# Patient Record
Sex: Male | Born: 2000 | Race: White | Hispanic: No | Marital: Single | Smoking: Current some day smoker
Health system: Southern US, Community
[De-identification: ages and names within clinical notes are randomized; demographics above are authoritative.]

---
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
## 2021-01-13 ENCOUNTER — Other Ambulatory Visit: Payer: Self-pay

## 2021-01-13 ENCOUNTER — Other Ambulatory Visit: Payer: Self-pay | Admitting: Family Medicine

## 2021-01-13 ENCOUNTER — Ambulatory Visit: Payer: Self-pay

## 2021-01-13 DIAGNOSIS — Z0000 Encounter for general adult medical examination without abnormal findings: Secondary | ICD-10-CM

## 2021-02-22 DIAGNOSIS — S0081XA Abrasion of other part of head, initial encounter: Secondary | ICD-10-CM | POA: Diagnosis not present

## 2021-02-22 DIAGNOSIS — R55 Syncope and collapse: Secondary | ICD-10-CM | POA: Diagnosis not present

## 2021-02-22 DIAGNOSIS — S0031XA Abrasion of nose, initial encounter: Secondary | ICD-10-CM | POA: Diagnosis not present

## 2021-02-23 DIAGNOSIS — R519 Headache, unspecified: Secondary | ICD-10-CM | POA: Diagnosis not present

## 2021-02-23 DIAGNOSIS — R55 Syncope and collapse: Secondary | ICD-10-CM | POA: Diagnosis not present

## 2021-02-23 DIAGNOSIS — S0081XA Abrasion of other part of head, initial encounter: Secondary | ICD-10-CM | POA: Diagnosis not present

## 2021-02-23 DIAGNOSIS — K2970 Gastritis, unspecified, without bleeding: Secondary | ICD-10-CM | POA: Diagnosis not present
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
## 2021-03-19 ENCOUNTER — Encounter: Payer: Self-pay | Admitting: Cardiology

## 2021-03-19 ENCOUNTER — Other Ambulatory Visit: Payer: Self-pay

## 2021-03-19 ENCOUNTER — Ambulatory Visit: Payer: BC Managed Care – PPO | Admitting: Cardiology

## 2021-03-19 VITALS — Resp 16 | Ht 68.0 in | Wt 213.0 lb

## 2021-03-19 DIAGNOSIS — R030 Elevated blood-pressure reading, without diagnosis of hypertension: Secondary | ICD-10-CM

## 2021-03-19 DIAGNOSIS — R55 Syncope and collapse: Secondary | ICD-10-CM | POA: Insufficient documentation

## 2021-04-07 ENCOUNTER — Ambulatory Visit: Payer: Managed Care, Other (non HMO)

## 2021-04-07 ENCOUNTER — Other Ambulatory Visit: Payer: Self-pay

## 2021-04-07 DIAGNOSIS — R55 Syncope and collapse: Secondary | ICD-10-CM | POA: Diagnosis not present

## 2021-04-09 DIAGNOSIS — R55 Syncope and collapse: Secondary | ICD-10-CM | POA: Diagnosis not present

## 2021-04-09 DIAGNOSIS — R519 Headache, unspecified: Secondary | ICD-10-CM | POA: Diagnosis not present

## 2021-04-09 DIAGNOSIS — K219 Gastro-esophageal reflux disease without esophagitis: Secondary | ICD-10-CM | POA: Diagnosis not present

## 2021-04-09 DIAGNOSIS — K2970 Gastritis, unspecified, without bleeding: Secondary | ICD-10-CM | POA: Diagnosis not present

## 2021-05-01 DIAGNOSIS — H04123 Dry eye syndrome of bilateral lacrimal glands: Secondary | ICD-10-CM | POA: Diagnosis not present

## 2021-05-01 DIAGNOSIS — H40033 Anatomical narrow angle, bilateral: Secondary | ICD-10-CM | POA: Diagnosis not present

## 2021-06-08 DIAGNOSIS — K219 Gastro-esophageal reflux disease without esophagitis: Secondary | ICD-10-CM | POA: Diagnosis not present

## 2021-06-08 DIAGNOSIS — F331 Major depressive disorder, recurrent, moderate: Secondary | ICD-10-CM | POA: Diagnosis not present

## 2021-06-08 DIAGNOSIS — G4700 Insomnia, unspecified: Secondary | ICD-10-CM | POA: Diagnosis not present

## 2021-06-08 DIAGNOSIS — F411 Generalized anxiety disorder: Secondary | ICD-10-CM | POA: Diagnosis not present

## 2021-06-16 DIAGNOSIS — R030 Elevated blood-pressure reading, without diagnosis of hypertension: Secondary | ICD-10-CM | POA: Insufficient documentation

## 2021-06-16 NOTE — Progress Notes (Signed)
No show

## 2021-06-17 ENCOUNTER — Ambulatory Visit: Payer: Managed Care, Other (non HMO) | Admitting: Cardiology

## 2021-06-17 DIAGNOSIS — R55 Syncope and collapse: Secondary | ICD-10-CM

## 2021-06-17 DIAGNOSIS — R030 Elevated blood-pressure reading, without diagnosis of hypertension: Secondary | ICD-10-CM

## 2021-06-24 DIAGNOSIS — Z23 Encounter for immunization: Secondary | ICD-10-CM | POA: Diagnosis not present

## 2021-06-24 DIAGNOSIS — Z1152 Encounter for screening for COVID-19: Secondary | ICD-10-CM | POA: Diagnosis not present

## 2021-07-02 DIAGNOSIS — E785 Hyperlipidemia, unspecified: Secondary | ICD-10-CM | POA: Diagnosis not present

## 2021-07-02 DIAGNOSIS — Z0000 Encounter for general adult medical examination without abnormal findings: Secondary | ICD-10-CM | POA: Diagnosis not present

## 2022-05-05 DIAGNOSIS — K219 Gastro-esophageal reflux disease without esophagitis: Secondary | ICD-10-CM | POA: Diagnosis not present

## 2022-05-05 DIAGNOSIS — F411 Generalized anxiety disorder: Secondary | ICD-10-CM | POA: Diagnosis not present

## 2022-05-05 DIAGNOSIS — F331 Major depressive disorder, recurrent, moderate: Secondary | ICD-10-CM | POA: Diagnosis not present

## 2022-05-05 DIAGNOSIS — G4700 Insomnia, unspecified: Secondary | ICD-10-CM | POA: Diagnosis not present

## 2022-05-25 DIAGNOSIS — Z23 Encounter for immunization: Secondary | ICD-10-CM | POA: Diagnosis not present

## 2022-05-27 IMAGING — DX DG CHEST 2V
2 series · 2 of 2 positions shown · non-contrast
Comparison: None.

CLINICAL DATA: Physical exam.

EXAM:
CHEST - 2 VIEW

[chest pa]
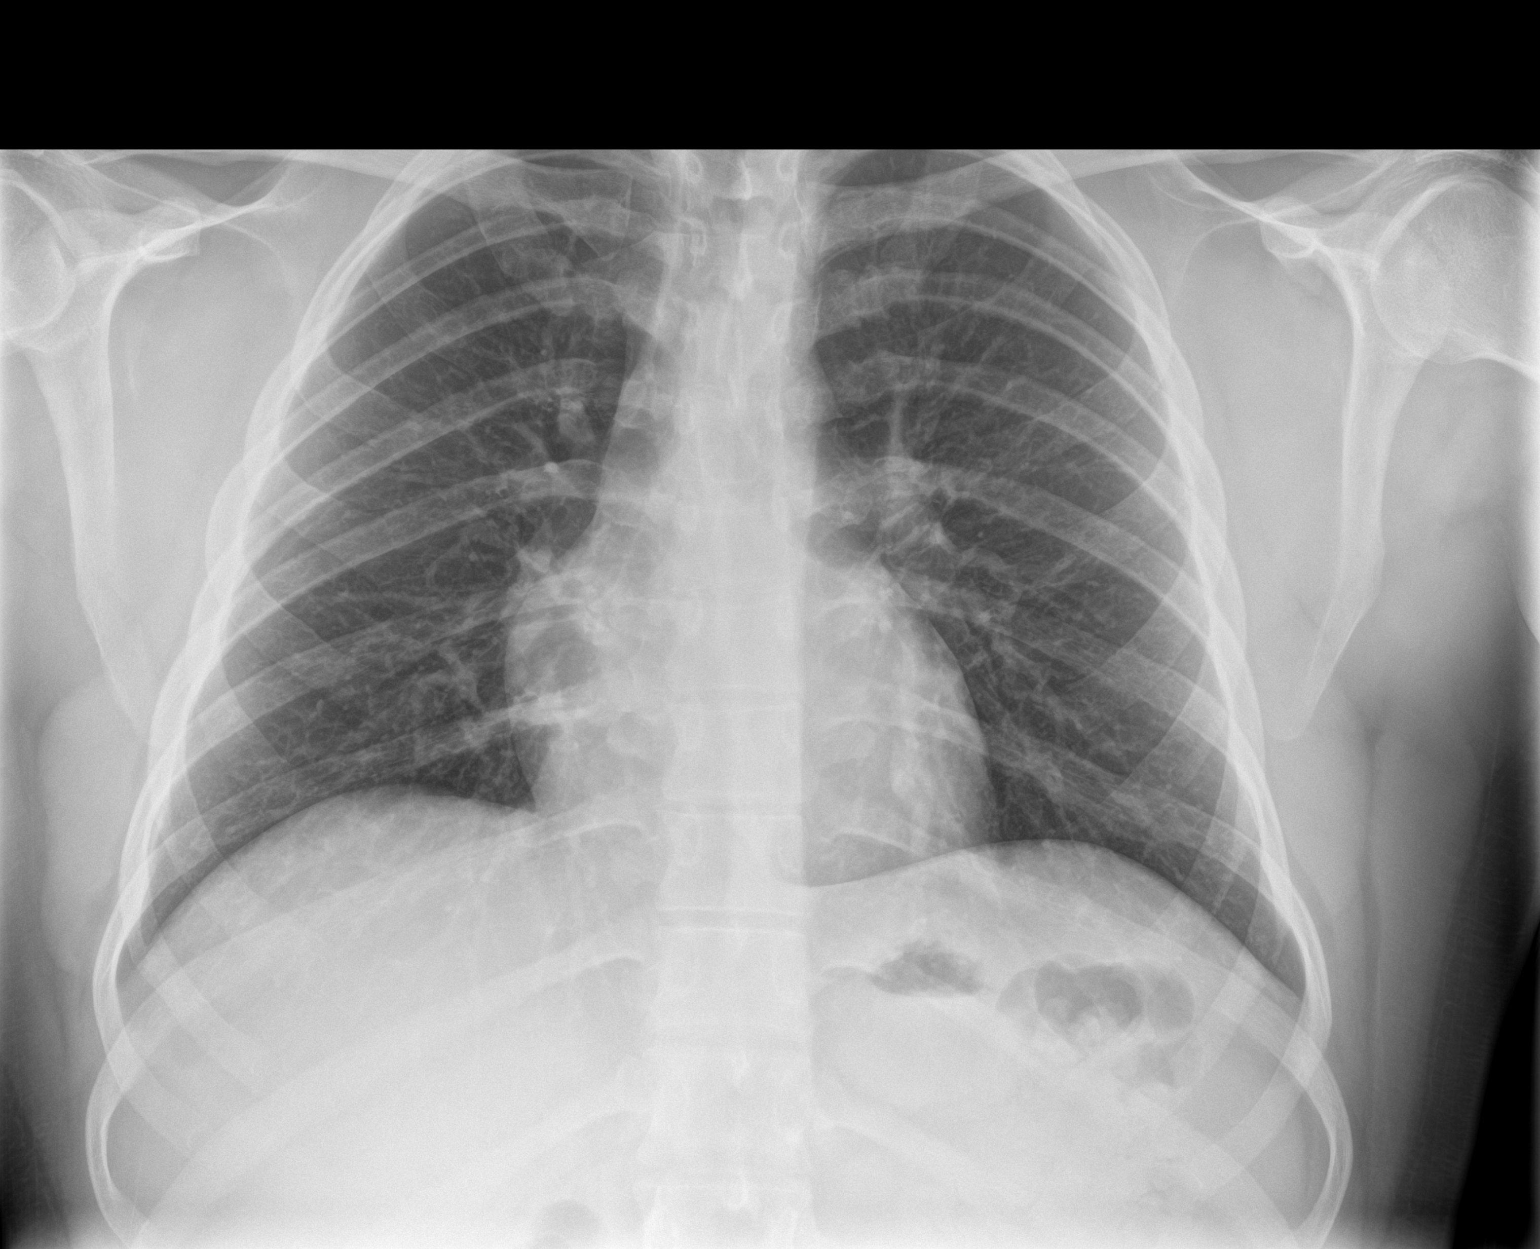

[chest lat]
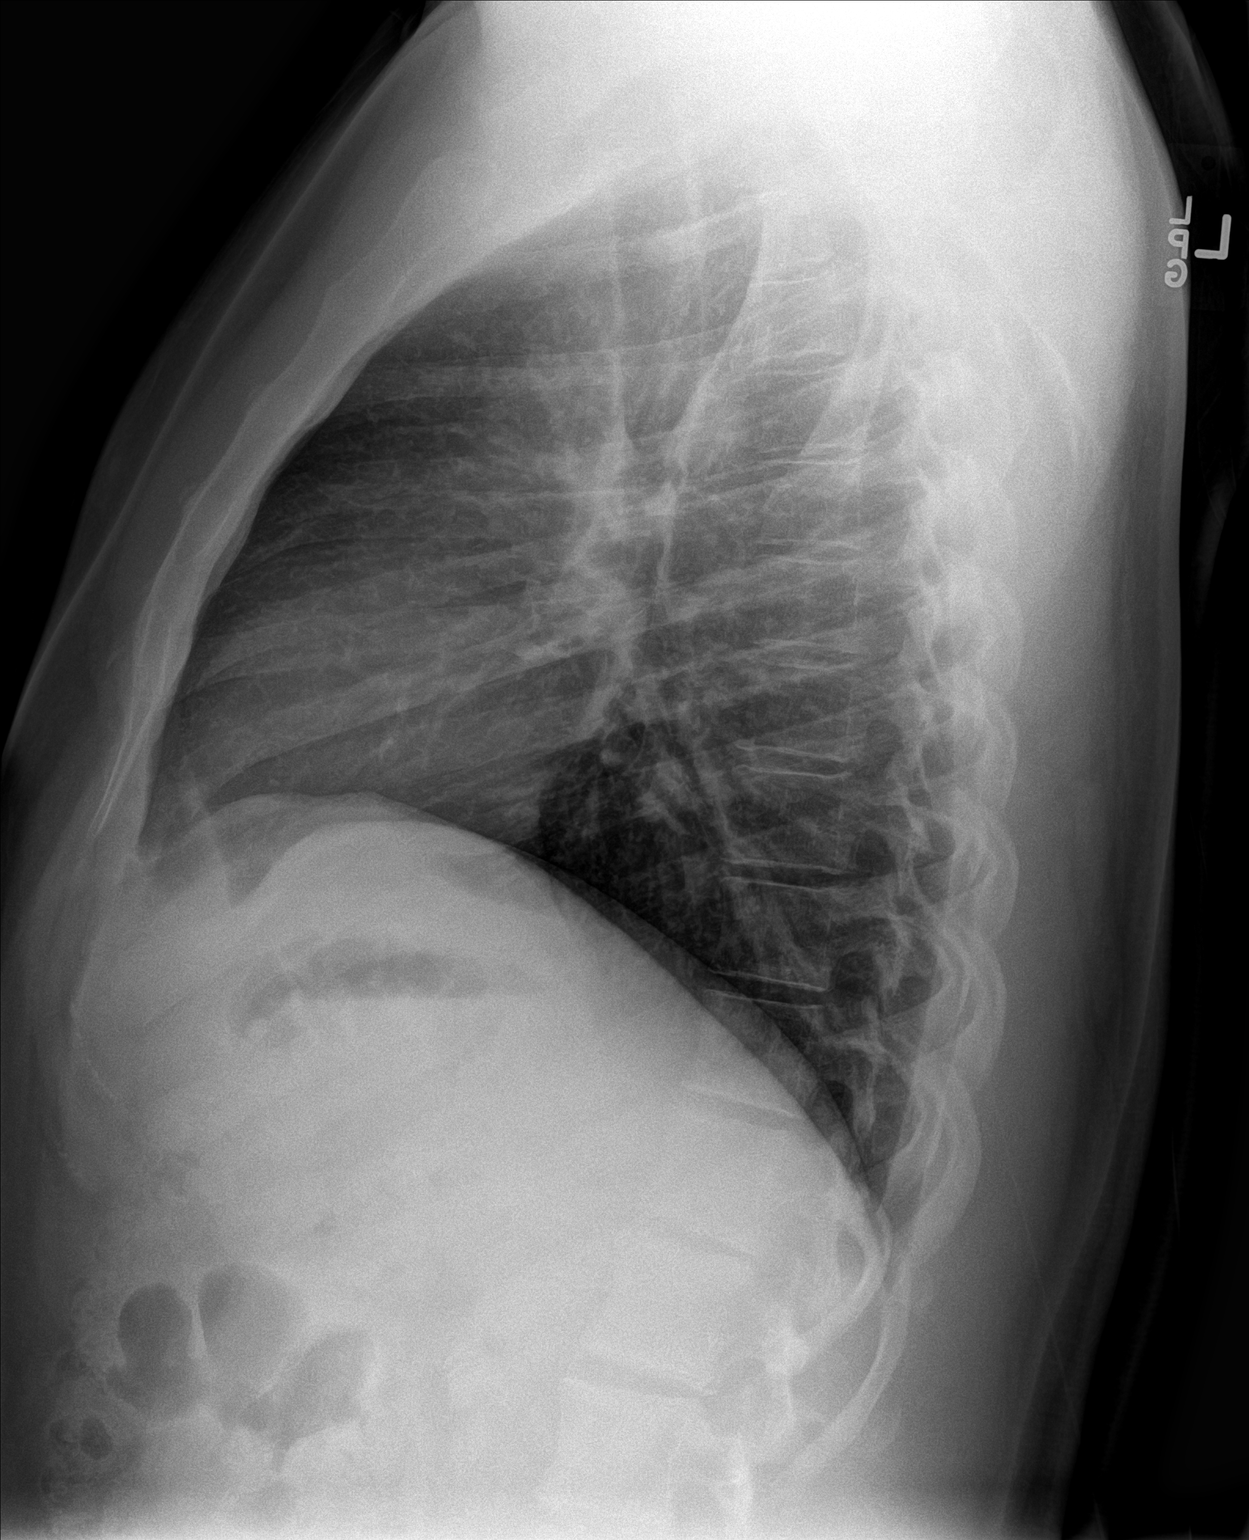

[2 of 2 positions shown; findings below may reference images not displayed]

FINDINGS: The heart size and mediastinal contours are within normal limits.
Both lungs are clear. The visualized skeletal structures are
unremarkable.
IMPRESSION: No active cardiopulmonary disease.

## 2022-07-05 DIAGNOSIS — Z0000 Encounter for general adult medical examination without abnormal findings: Secondary | ICD-10-CM | POA: Diagnosis not present

## 2022-11-23 DIAGNOSIS — H1045 Other chronic allergic conjunctivitis: Secondary | ICD-10-CM | POA: Diagnosis not present

## 2022-11-23 DIAGNOSIS — H0102B Squamous blepharitis left eye, upper and lower eyelids: Secondary | ICD-10-CM | POA: Diagnosis not present

## 2022-11-23 DIAGNOSIS — H0102A Squamous blepharitis right eye, upper and lower eyelids: Secondary | ICD-10-CM | POA: Diagnosis not present

## 2022-11-23 DIAGNOSIS — H5213 Myopia, bilateral: Secondary | ICD-10-CM | POA: Diagnosis not present

## 2023-01-05 DIAGNOSIS — G4700 Insomnia, unspecified: Secondary | ICD-10-CM | POA: Diagnosis not present

## 2023-01-05 DIAGNOSIS — E782 Mixed hyperlipidemia: Secondary | ICD-10-CM | POA: Diagnosis not present

## 2023-01-05 DIAGNOSIS — J309 Allergic rhinitis, unspecified: Secondary | ICD-10-CM | POA: Diagnosis not present

## 2023-01-05 DIAGNOSIS — K219 Gastro-esophageal reflux disease without esophagitis: Secondary | ICD-10-CM | POA: Diagnosis not present

## 2023-02-02 DIAGNOSIS — L237 Allergic contact dermatitis due to plants, except food: Secondary | ICD-10-CM | POA: Diagnosis not present

## 2023-05-20 DIAGNOSIS — L237 Allergic contact dermatitis due to plants, except food: Secondary | ICD-10-CM | POA: Diagnosis not present

## 2023-05-20 DIAGNOSIS — M549 Dorsalgia, unspecified: Secondary | ICD-10-CM | POA: Diagnosis not present

## 2023-05-20 DIAGNOSIS — S39012A Strain of muscle, fascia and tendon of lower back, initial encounter: Secondary | ICD-10-CM | POA: Diagnosis not present

## 2023-05-20 DIAGNOSIS — M2550 Pain in unspecified joint: Secondary | ICD-10-CM | POA: Diagnosis not present

## 2023-06-30 DIAGNOSIS — Z0000 Encounter for general adult medical examination without abnormal findings: Secondary | ICD-10-CM | POA: Diagnosis not present

## 2023-06-30 DIAGNOSIS — Z114 Encounter for screening for human immunodeficiency virus [HIV]: Secondary | ICD-10-CM | POA: Diagnosis not present

## 2023-06-30 DIAGNOSIS — Z13220 Encounter for screening for lipoid disorders: Secondary | ICD-10-CM | POA: Diagnosis not present

## 2023-07-07 DIAGNOSIS — Z0000 Encounter for general adult medical examination without abnormal findings: Secondary | ICD-10-CM | POA: Diagnosis not present

## 2023-07-07 DIAGNOSIS — R7989 Other specified abnormal findings of blood chemistry: Secondary | ICD-10-CM | POA: Diagnosis not present

## 2023-07-07 DIAGNOSIS — Z23 Encounter for immunization: Secondary | ICD-10-CM | POA: Diagnosis not present

## 2023-08-02 DIAGNOSIS — R7989 Other specified abnormal findings of blood chemistry: Secondary | ICD-10-CM | POA: Diagnosis not present

## 2023-08-08 DIAGNOSIS — K219 Gastro-esophageal reflux disease without esophagitis: Secondary | ICD-10-CM | POA: Diagnosis not present

## 2023-08-08 DIAGNOSIS — F411 Generalized anxiety disorder: Secondary | ICD-10-CM | POA: Diagnosis not present

## 2023-08-08 DIAGNOSIS — F331 Major depressive disorder, recurrent, moderate: Secondary | ICD-10-CM | POA: Diagnosis not present

## 2023-08-08 DIAGNOSIS — G4700 Insomnia, unspecified: Secondary | ICD-10-CM | POA: Diagnosis not present

## 2023-08-25 DIAGNOSIS — J069 Acute upper respiratory infection, unspecified: Secondary | ICD-10-CM | POA: Diagnosis not present

## 2023-10-11 DIAGNOSIS — F331 Major depressive disorder, recurrent, moderate: Secondary | ICD-10-CM | POA: Diagnosis not present

## 2023-10-11 DIAGNOSIS — G4700 Insomnia, unspecified: Secondary | ICD-10-CM | POA: Diagnosis not present

## 2023-10-11 DIAGNOSIS — F411 Generalized anxiety disorder: Secondary | ICD-10-CM | POA: Diagnosis not present

## 2023-10-26 DIAGNOSIS — A749 Chlamydial infection, unspecified: Secondary | ICD-10-CM | POA: Diagnosis not present

## 2023-10-26 DIAGNOSIS — N3000 Acute cystitis without hematuria: Secondary | ICD-10-CM | POA: Diagnosis not present

## 2023-10-26 DIAGNOSIS — F411 Generalized anxiety disorder: Secondary | ICD-10-CM | POA: Diagnosis not present

## 2023-10-26 DIAGNOSIS — Z7721 Contact with and (suspected) exposure to potentially hazardous body fluids: Secondary | ICD-10-CM | POA: Diagnosis not present

## 2023-10-26 DIAGNOSIS — A549 Gonococcal infection, unspecified: Secondary | ICD-10-CM | POA: Diagnosis not present

## 2023-12-12 DIAGNOSIS — F331 Major depressive disorder, recurrent, moderate: Secondary | ICD-10-CM | POA: Diagnosis not present

## 2023-12-12 DIAGNOSIS — G4700 Insomnia, unspecified: Secondary | ICD-10-CM | POA: Diagnosis not present

## 2023-12-12 DIAGNOSIS — F1510 Other stimulant abuse, uncomplicated: Secondary | ICD-10-CM | POA: Diagnosis not present

## 2023-12-12 DIAGNOSIS — F411 Generalized anxiety disorder: Secondary | ICD-10-CM | POA: Diagnosis not present

## 2023-12-19 DIAGNOSIS — F411 Generalized anxiety disorder: Secondary | ICD-10-CM | POA: Diagnosis not present

## 2024-01-05 DIAGNOSIS — F411 Generalized anxiety disorder: Secondary | ICD-10-CM | POA: Diagnosis not present

## 2024-02-01 DIAGNOSIS — L237 Allergic contact dermatitis due to plants, except food: Secondary | ICD-10-CM | POA: Diagnosis not present

## 2024-02-01 DIAGNOSIS — R21 Rash and other nonspecific skin eruption: Secondary | ICD-10-CM | POA: Diagnosis not present

## 2024-02-02 DIAGNOSIS — F411 Generalized anxiety disorder: Secondary | ICD-10-CM | POA: Diagnosis not present

## 2024-02-06 DIAGNOSIS — G4700 Insomnia, unspecified: Secondary | ICD-10-CM | POA: Diagnosis not present

## 2024-02-06 DIAGNOSIS — F331 Major depressive disorder, recurrent, moderate: Secondary | ICD-10-CM | POA: Diagnosis not present

## 2024-02-06 DIAGNOSIS — F411 Generalized anxiety disorder: Secondary | ICD-10-CM | POA: Diagnosis not present

## 2024-02-16 DIAGNOSIS — F411 Generalized anxiety disorder: Secondary | ICD-10-CM | POA: Diagnosis not present

## 2024-03-15 DIAGNOSIS — H0102A Squamous blepharitis right eye, upper and lower eyelids: Secondary | ICD-10-CM | POA: Diagnosis not present

## 2024-03-15 DIAGNOSIS — H16423 Pannus (corneal), bilateral: Secondary | ICD-10-CM | POA: Diagnosis not present

## 2024-03-15 DIAGNOSIS — H0102B Squamous blepharitis left eye, upper and lower eyelids: Secondary | ICD-10-CM | POA: Diagnosis not present

## 2024-03-15 DIAGNOSIS — H1045 Other chronic allergic conjunctivitis: Secondary | ICD-10-CM | POA: Diagnosis not present

## 2024-03-15 DIAGNOSIS — F411 Generalized anxiety disorder: Secondary | ICD-10-CM | POA: Diagnosis not present

## 2024-04-17 DIAGNOSIS — F411 Generalized anxiety disorder: Secondary | ICD-10-CM | POA: Diagnosis not present

## 2024-04-30 DIAGNOSIS — J069 Acute upper respiratory infection, unspecified: Secondary | ICD-10-CM | POA: Diagnosis not present

## 2024-04-30 DIAGNOSIS — J309 Allergic rhinitis, unspecified: Secondary | ICD-10-CM | POA: Diagnosis not present

## 2024-04-30 DIAGNOSIS — R0982 Postnasal drip: Secondary | ICD-10-CM | POA: Diagnosis not present

## 2024-05-26 DIAGNOSIS — H53142 Visual discomfort, left eye: Secondary | ICD-10-CM | POA: Diagnosis not present

## 2024-05-26 DIAGNOSIS — H53149 Visual discomfort, unspecified: Secondary | ICD-10-CM | POA: Diagnosis not present

## 2024-05-26 DIAGNOSIS — Y7711 Contact lens associated with adverse incidents: Secondary | ICD-10-CM | POA: Diagnosis not present

## 2024-05-26 DIAGNOSIS — H11422 Conjunctival edema, left eye: Secondary | ICD-10-CM | POA: Diagnosis not present

## 2024-05-26 DIAGNOSIS — H16002 Unspecified corneal ulcer, left eye: Secondary | ICD-10-CM | POA: Diagnosis not present

## 2024-05-26 DIAGNOSIS — H5712 Ocular pain, left eye: Secondary | ICD-10-CM | POA: Diagnosis not present

## 2024-05-28 DIAGNOSIS — H16002 Unspecified corneal ulcer, left eye: Secondary | ICD-10-CM | POA: Diagnosis not present

## 2024-05-30 DIAGNOSIS — H16002 Unspecified corneal ulcer, left eye: Secondary | ICD-10-CM | POA: Diagnosis not present

## 2024-06-01 DIAGNOSIS — H16002 Unspecified corneal ulcer, left eye: Secondary | ICD-10-CM | POA: Diagnosis not present

## 2024-06-04 DIAGNOSIS — H16002 Unspecified corneal ulcer, left eye: Secondary | ICD-10-CM | POA: Diagnosis not present

## 2024-07-04 DIAGNOSIS — Z0000 Encounter for general adult medical examination without abnormal findings: Secondary | ICD-10-CM | POA: Diagnosis not present

## 2024-07-04 DIAGNOSIS — Z13220 Encounter for screening for lipoid disorders: Secondary | ICD-10-CM | POA: Diagnosis not present
